# Patient Record
Sex: Male | Born: 1995 | Race: Asian | Hispanic: No | Marital: Single | Smoking: Never smoker
Health system: Southern US, Community
[De-identification: ages and names within clinical notes are randomized; demographics above are authoritative.]

## PROBLEM LIST (undated history)

## (undated) DIAGNOSIS — R45851 Suicidal ideations: Secondary | ICD-10-CM

## (undated) DIAGNOSIS — F32A Depression, unspecified: Secondary | ICD-10-CM

## (undated) DIAGNOSIS — F329 Major depressive disorder, single episode, unspecified: Secondary | ICD-10-CM

## (undated) DIAGNOSIS — F319 Bipolar disorder, unspecified: Secondary | ICD-10-CM

---
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
## 2017-04-18 ENCOUNTER — Emergency Department (HOSPITAL_COMMUNITY)
Admission: EM | Admit: 2017-04-18 | Discharge: 2017-04-18 | Disposition: A | Discharge status: Home / Self Care | Payer: No Typology Code available for payment source | Attending: Emergency Medicine | Admitting: Emergency Medicine

## 2017-04-18 ENCOUNTER — Encounter (HOSPITAL_COMMUNITY): Payer: Self-pay

## 2017-04-18 DIAGNOSIS — T887XXA Unspecified adverse effect of drug or medicament, initial encounter: Secondary | ICD-10-CM | POA: Insufficient documentation

## 2017-04-18 DIAGNOSIS — Z79899 Other long term (current) drug therapy: Secondary | ICD-10-CM | POA: Insufficient documentation

## 2017-04-18 DIAGNOSIS — T7840XA Allergy, unspecified, initial encounter: Secondary | ICD-10-CM

## 2017-04-18 DIAGNOSIS — Y69 Unspecified misadventure during surgical and medical care: Secondary | ICD-10-CM | POA: Insufficient documentation

## 2017-04-18 DIAGNOSIS — R070 Pain in throat: Secondary | ICD-10-CM | POA: Insufficient documentation

## 2017-04-18 HISTORY — DX: Bipolar disorder, unspecified: F31.9

## 2017-04-18 MED ORDER — DIPHENHYDRAMINE HCL 25 MG PO CAPS
50.0000 mg | ORAL_CAPSULE | Freq: Once | ORAL | Status: AC
  Administered 2017-04-18: 50 mg via ORAL
  Filled 2017-04-18: qty 2

## 2017-04-18 NOTE — ED Provider Notes (Signed)
[facility]-EMERGENCY DEPT Provider Note   CSN: [id number] Arrival date & time: 04/18/17  0707     History   Chief Complaint Chief Complaint  Patient presents with  . Allergic Reaction    HPI Ricardo Snyder is a 22 y.o. male.  The history is provided by the patient. No language interpreter was used.  Allergic Reaction  Presenting symptoms: swelling   Severity:  Mild Duration:  2 days Prior allergic episodes:  Allergies to medications Relieved by:  Nothing Worsened by:  Nothing Ineffective treatments:  None tried Pt reports he was recently started on lamictal for bipolar disorder.  Pt reports he feels like his tongue is swollen.  Pt reports his throat is sore.   Past Medical History:  Diagnosis Date  . Bipolar 1 disorder (HCC)   . Depression   . Suicidal ideation     Patient Active Problem List   Diagnosis Date Noted  . MDD (major depressive disorder), recurrent episode, severe (HCC) 11/22/2016    History reviewed. No pertinent surgical history.     Home Medications    Prior to Admission medications   Medication Sig Start Date End Date Taking? Authorizing Provider  hydrOXYzine (ATARAX/VISTARIL) 25 MG tablet Take 25 mg by mouth at bedtime.   Yes [provider]  lamoTRIgine (LAMICTAL) 25 MG tablet Take 25 mg by mouth at bedtime. 02/20/17  Yes [provider]  sertraline (ZOLOFT) 100 MG tablet Take 1 tablet (100 mg total) by mouth daily. 11/29/16  Yes Oneta RackLewis, Tanika N, NP  traZODone (DESYREL) 50 MG tablet Take 1 tablet (50 mg total) by mouth at bedtime as needed for sleep. 11/28/16  Yes Oneta RackLewis, Tanika N, NP    Family History History reviewed. No pertinent family history.  Social History Social History   Tobacco Use  . Smoking status: Never Smoker  . Smokeless tobacco: Never Used  Substance Use Topics  . Alcohol use: No  . Drug use: No     Allergies   Patient has no known allergies.   Review of  Systems Review of Systems  All other systems reviewed and are negative.    Physical Exam Updated Vital Signs BP 120/81   Pulse 68   Temp 98 F (36.7 C)   Resp 16   SpO2 98%   Physical Exam  Constitutional: He appears well-developed and well-nourished.  HENT:  Head: Normocephalic.  Right Ear: External ear normal.  Left Ear: External ear normal.  Mouth/Throat: Oropharynx is clear and moist.  No obvious tongue swelling,  Throat is clear, no edema.  Pt talks in a whisper.    Cardiovascular: Normal rate.  Pulmonary/Chest: Effort normal.  Abdominal: Soft.  Musculoskeletal: Normal range of motion.  Neurological: He is alert.  Skin: Skin is warm.  Psychiatric: He has a normal mood and affect.  Nursing note and vitals reviewed.    ED Treatments / Results  Labs (all labs ordered are listed, but only abnormal results are displayed) Labs Reviewed - No data to display  EKG  EKG Interpretation None     MDM  Pt given benadryl Pt reports his throat feels better.  Pt reports he doesn't feel swollen.    Radiology No results found.  Procedures Procedures (including critical care time)  Medications Ordered in ED Medications  diphenhydrAMINE (BENADRYL) capsule 50 mg (50 mg Oral Given 04/18/17 0805)     Initial Impression / Assessment and Plan / ED Course  I have reviewed the triage vital signs  and the nursing notes.  Pertinent labs & imaging results that were available during my care of the patient were reviewed by me and considered in my medical decision making (see chart for details).     Pt given benadryl po.  Pt observed x 2 hours.  No airway issues.  I advised stop lamictal.  Call Psychiatrist to discuss other treatments.     Final Clinical Impressions(s) / ED Diagnoses   Final diagnoses:  Allergic reaction to drug, initial encounter    ED Discharge Orders    None    An After Visit Summary was printed and given to the patient.    Osie Cheeks 04/18/17 1122    Lorre Nick, MD 04/18/17 [phone]

## 2017-04-18 NOTE — ED Notes (Signed)
Bed: WTR5 Expected date:  Expected time:  Means of arrival:  Comments: 

## 2017-04-18 NOTE — Discharge Instructions (Signed)
Benadryl 50 mg every 4 hours.

## 2017-04-18 NOTE — ED Notes (Signed)
Bed: WA20 Expected date:  Expected time:  Means of arrival:  Comments: 

## 2017-04-18 NOTE — ED Notes (Signed)
Pt started taking a new bipolar med for the last two days and states that it's making his throat sore and it feels like his tongue is swelling up

## 2018-05-26 IMAGING — CR DG LUMBAR SPINE COMPLETE 4+V
5 series · 5 of 5 positions shown · non-contrast
Comparison: None.

CLINICAL DATA: Pt complains of lower back pain x 2 weeks. Denies
injury/physical activity that may have caused pain.

EXAM:
LUMBAR SPINE - COMPLETE 4+ VIEW

[t lumbar spine ap]
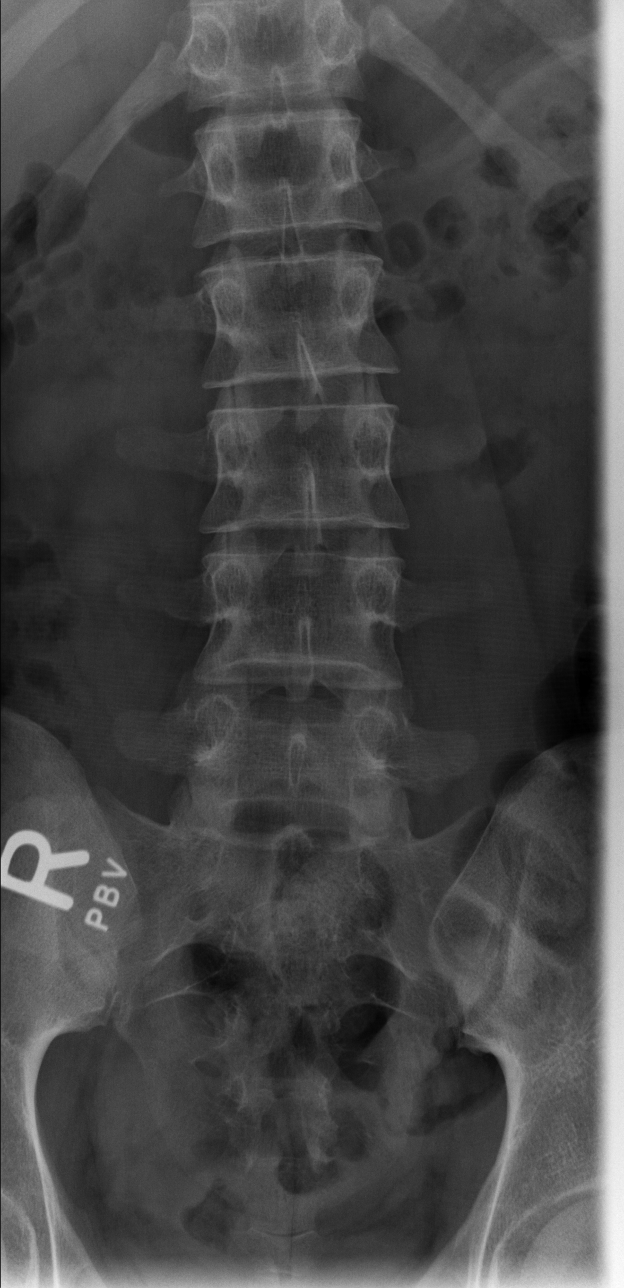

[t lumbar spine obl (1 of 2)]
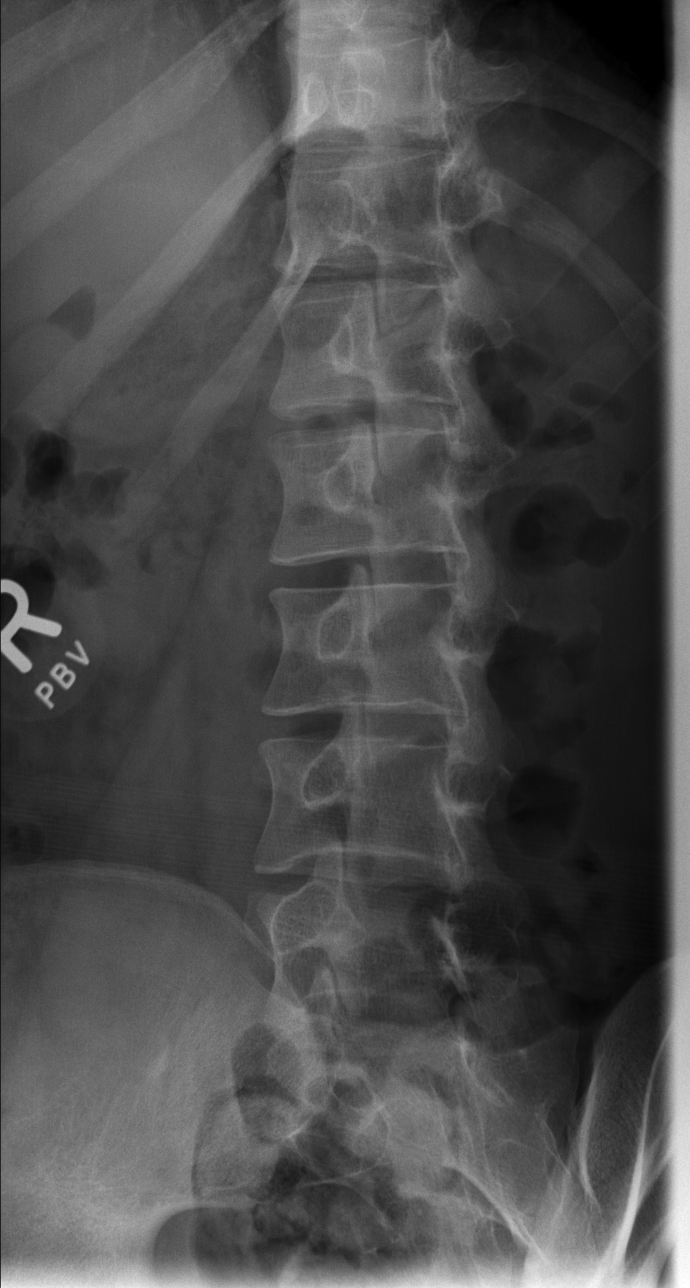

[t lumbar spine obl (2 of 2)]
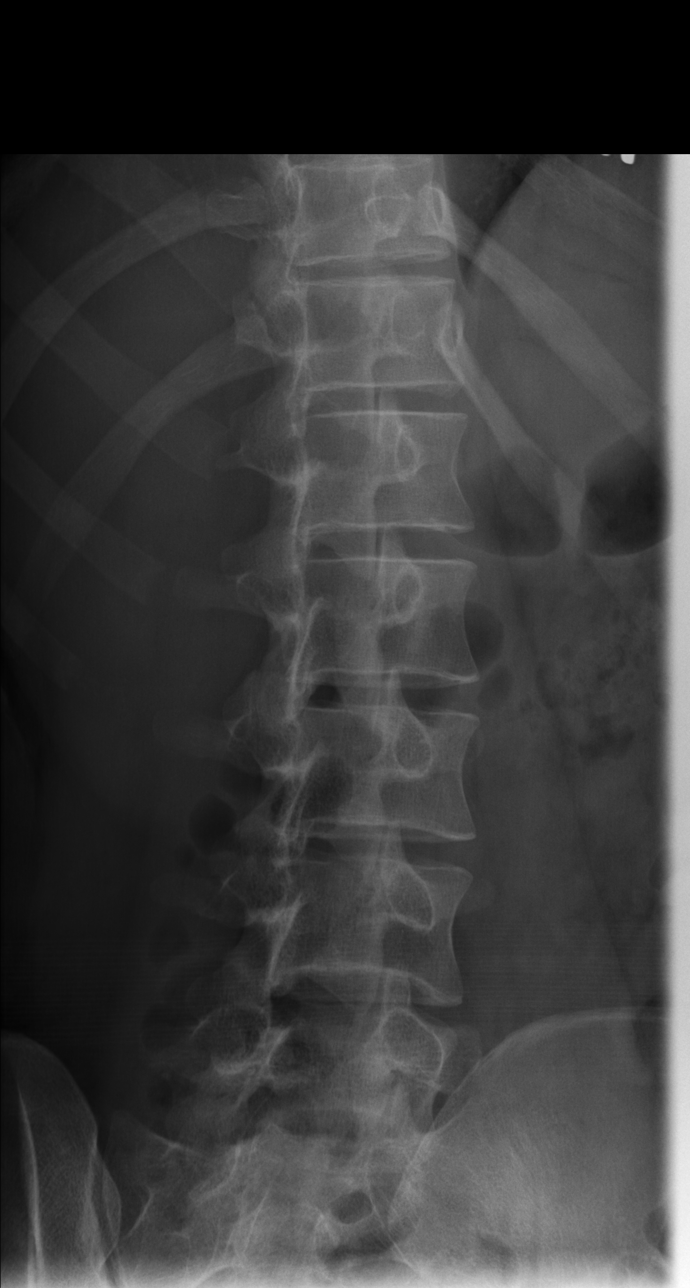

[t lumbar spine lat (1 of 2)]
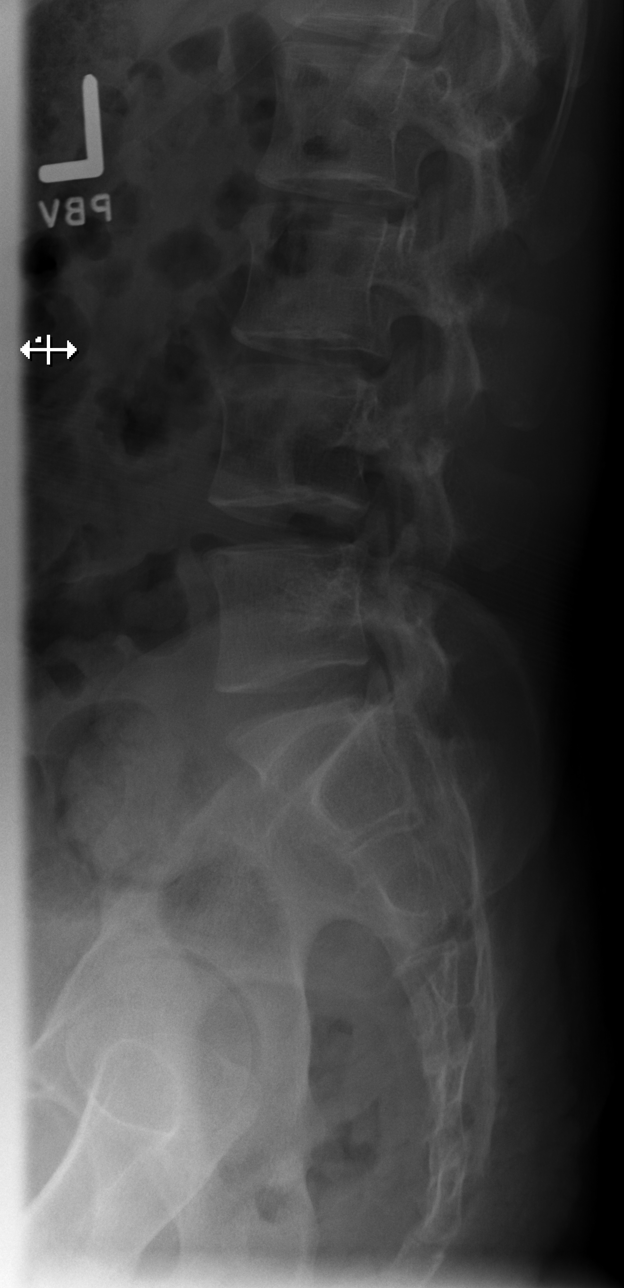

[t lumbar spine lat (2 of 2)]
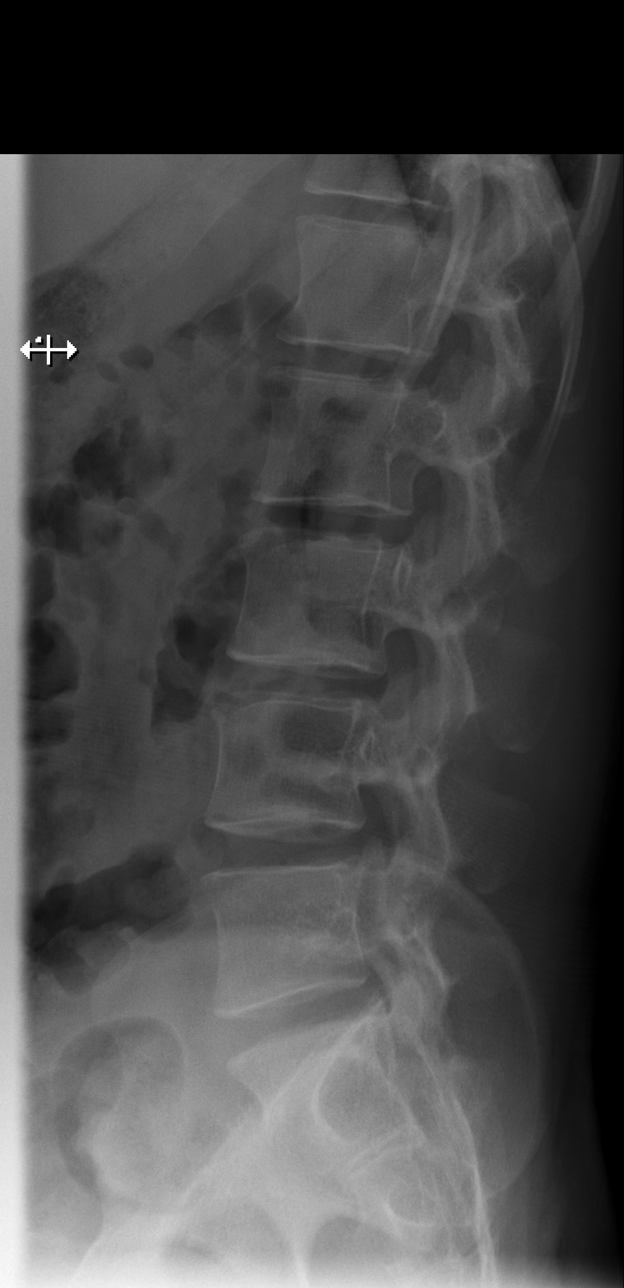

[5 of 5 positions shown; findings below may reference images not displayed]

FINDINGS: Mild levoscoliosis and straightening of the lumbar spine may be
related to patient positioning. No evidence of acute vertebral body
subluxation. Bone mineralization is normal. No fracture line or
displaced fracture fragment. No acute or suspicious osseous lesion.
No evidence of pars interarticularis defect seen. Visualized
paravertebral soft tissues are unremarkable.
IMPRESSION: 1. Mild scoliosis and straightening of the lumbar spine lordosis,
possibly accentuated by patient positioning.
2. No acute findings.
